# Patient Record
Sex: Female | Born: 1990 | Race: Black or African American | Hispanic: No | Marital: Single | Smoking: Current every day smoker
Health system: Southern US, Community
[De-identification: ages and names within clinical notes are randomized; demographics above are authoritative.]

---

## 2018-03-08 ENCOUNTER — Other Ambulatory Visit: Payer: Self-pay

## 2018-03-08 ENCOUNTER — Emergency Department (HOSPITAL_COMMUNITY)
Admission: EM | Admit: 2018-03-08 | Discharge: 2018-03-09 | Disposition: A | Discharge status: Home / Self Care | Payer: Self-pay | Attending: Emergency Medicine | Admitting: Emergency Medicine

## 2018-03-08 ENCOUNTER — Encounter (HOSPITAL_COMMUNITY): Payer: Self-pay

## 2018-03-08 DIAGNOSIS — R1084 Generalized abdominal pain: Secondary | ICD-10-CM | POA: Insufficient documentation

## 2018-03-08 DIAGNOSIS — F17290 Nicotine dependence, other tobacco product, uncomplicated: Secondary | ICD-10-CM | POA: Insufficient documentation

## 2018-03-08 DIAGNOSIS — R112 Nausea with vomiting, unspecified: Secondary | ICD-10-CM | POA: Insufficient documentation

## 2018-03-08 DIAGNOSIS — R197 Diarrhea, unspecified: Secondary | ICD-10-CM | POA: Insufficient documentation

## 2018-03-08 LAB — CBC
HCT: 45.4 % (ref 36.0–46.0)
Hemoglobin: 14.3 g/dL (ref 12.0–15.0)
MCH: 28.2 pg (ref 26.0–34.0)
MCHC: 31.5 g/dL (ref 30.0–36.0)
MCV: 89.5 fL (ref 78.0–100.0)
Platelets: 348 10*3/uL (ref 150–400)
RBC: 5.07 MIL/uL (ref 3.87–5.11)
RDW: 12.1 % (ref 11.5–15.5)
WBC: 8.7 10*3/uL (ref 4.0–10.5)

## 2018-03-08 LAB — URINALYSIS, ROUTINE W REFLEX MICROSCOPIC
Bilirubin Urine: NEGATIVE
Glucose, UA: NEGATIVE mg/dL
Hgb urine dipstick: NEGATIVE
Ketones, ur: NEGATIVE mg/dL
Leukocytes, UA: NEGATIVE
Nitrite: NEGATIVE
Protein, ur: NEGATIVE mg/dL
Specific Gravity, Urine: 1.017 (ref 1.005–1.030)
pH: 5 (ref 5.0–8.0)

## 2018-03-08 LAB — COMPREHENSIVE METABOLIC PANEL
ALT: 24 U/L (ref 14–54)
AST: 23 U/L (ref 15–41)
Albumin: 3.9 g/dL (ref 3.5–5.0)
Alkaline Phosphatase: 52 U/L (ref 38–126)
Anion gap: 8 (ref 5–15)
BUN: 7 mg/dL (ref 6–20)
CO2: 26 mmol/L (ref 22–32)
Calcium: 9 mg/dL (ref 8.9–10.3)
Chloride: 105 mmol/L (ref 101–111)
Creatinine, Ser: 0.89 mg/dL (ref 0.44–1.00)
GFR calc Af Amer: >60 mL/min (ref >60)
GFR calc non Af Amer: >60 mL/min (ref >60)
Glucose, Bld: 90 mg/dL (ref 65–99)
Potassium: 3.4 mmol/L — ABNORMAL LOW (ref 3.5–5.1)
Sodium: 139 mmol/L (ref 135–145)
Total Bilirubin: 0.3 mg/dL (ref 0.3–1.2)
Total Protein: 6.7 g/dL (ref 6.5–8.1)

## 2018-03-08 LAB — I-STAT BETA HCG BLOOD, ED (MC, WL, AP ONLY): I-stat hCG, quantitative: <5 m[IU]/mL (ref <5)

## 2018-03-08 LAB — LIPASE, BLOOD: Lipase: 39 U/L (ref 11–51)

## 2018-03-08 MED ORDER — SODIUM CHLORIDE 0.9 % IV BOLUS
1000.0000 mL | Freq: Once | INTRAVENOUS | Status: AC
  Administered 2018-03-09: 1000 mL via INTRAVENOUS

## 2018-03-08 NOTE — ED Provider Notes (Signed)
Patient placed in Quick Look pathway, seen and evaluated   Chief Complaint: abdominal pain, vomiting  HPI:   Patient presents for 4 day history of generalized abdominal pain, several episodes of NBNB emesis and diarrhea. She was drinking alcohol 3 nights ago which she believed worsened the symptoms. No sick contacts with similar symptoms. Denies fever, urinary symptoms.  ROS: vomiting  Physical Exam:   Gen: No distress  Neuro: Awake and Alert  Skin: Warm    Focused Exam: no abdominal TTP, NAD.   Initiation of care has begun. The patient has been counseled on the process, plan, and necessity for staying for the completion/evaluation, and the remainder of the medical screening examination    Dietrich PatesKhatri, Karey Stucki, PA-C 03/08/18 2055    Abelino DerrickMackuen, Courteney Lyn, MD 03/08/18 2352

## 2018-03-08 NOTE — ED Triage Notes (Signed)
Onset 4 days ago pt was drinking alcohol, started to feel nauseated, stopped drinking, woke up next day vomiting,last vomit yesterday.  C/o abd pain.

## 2018-03-09 MED ORDER — ONDANSETRON HCL 4 MG PO TABS: 4.0000 mg | ORAL_TABLET | Freq: Three times a day (TID) | ORAL | 0 refills | Status: AC | PRN

## 2018-03-09 NOTE — Discharge Instructions (Signed)
1. Medications: Take Zofran as needed for nausea.  Wait around 20 minutes before eating or drinking after taking this medication. °2. Treatment: rest, drink plenty of fluids, advance diet slowly.  Start with water and broth then advance to bland foods that will not upset your stomach such as crackers, mashed potatoes, and peanut butter. °3. Follow Up: Please followup with your primary doctor in 3 days for discussion of your diagnoses and further evaluation after today's visit; if you do not have a primary care doctor use the resource guide provided to find one; Please return to the ER for persistent vomiting, high fevers or worsening symptoms ° °

## 2018-03-09 NOTE — ED Provider Notes (Signed)
[facility] Provider Note   CSN: [id number] Arrival date & time: 03/08/18  2000     History   Chief Complaint Chief Complaint  Patient presents with  . Abdominal Pain  . Nausea    HPI Stephanie Barr is a 27 y.o. female with no significant past medical history presents today for evaluation of acute onset, progressively improving nausea, vomiting, and diarrhea for 4 days.  She states that on Friday she had 3 glasses of wine which is not uncharacteristic of her but she began to feel sick after the third glass.  She is unsure of what she had to eat that day but she states "I am sure it had a lot of hot sauce on it ".  She states she eats most meals with hot sauce.  Saturday, 3 days ago, she had multiple episodes of nonbloody nonbilious emesis and a few episodes of watery nonbloody diarrhea.  She last vomited yesterday.  Notes intermittent fleeting sharp generalized abdominal pains which will last for a few seconds and then resolved.  No aggravating or alleviating factors noted.  She denies fevers, chills, chest pain, shortness of breath, lightheadedness, constipation, melena, hematochezia, urinary symptoms, or vaginal itching, bleeding, or discharge.  She has not tried anything for her symptoms.  Endorses decreased oral intake but has been able to keep food down all day today.  The history is provided by the patient.    History reviewed. No pertinent past medical history.  There are no active problems to display for this patient.   History reviewed. No pertinent surgical history.   OB History   None      Home Medications    Prior to Admission medications   Medication Sig Start Date End Date Taking? Authorizing Provider  ondansetron (ZOFRAN) 4 MG tablet Take 1 tablet (4 mg total) by mouth every 8 (eight) hours as needed for nausea or vomiting. 03/09/18   Jeanie SewerFawze, Jamise Pentland A, PA-C    Family History History reviewed. No pertinent family  history.  Social History Social History   Tobacco Use  . Smoking status: Current Every Day Smoker    Types: Cigars  . Smokeless tobacco: Never Used  . Tobacco comment: less than 1 per day   Substance Use Topics  . Alcohol use: Yes  . Drug use: Never     Allergies   Patient has no known allergies.   Review of Systems Review of Systems  Constitutional: Negative for chills and fever.  Respiratory: Negative for shortness of breath.   Cardiovascular: Negative for chest pain.  Gastrointestinal: Positive for abdominal pain, diarrhea, nausea and vomiting. Negative for blood in stool and constipation.  Genitourinary: Negative for dysuria, hematuria, urgency, vaginal bleeding, vaginal discharge and vaginal pain.  All other systems reviewed and are negative.    Physical Exam Updated Vital Signs BP 120/77   Pulse (!) 58   Temp 98.8 F (37.1 C) (Oral)   Resp 12   Ht 5\' 6"  (1.676 m)   Wt 81.6 kg (180 lb)   LMP 02/24/2018   SpO2 99%   BMI 29.05 kg/m   Physical Exam  Constitutional: She appears well-developed and well-nourished. No distress.  Resting comfortably in bed, no apparent distress.  HENT:  Head: Normocephalic and atraumatic.  Eyes: Conjunctivae are normal. Right eye exhibits no discharge. Left eye exhibits no discharge.  Neck: No JVD present. No tracheal deviation present.  Cardiovascular: Normal rate, regular rhythm and normal heart sounds.  Pulmonary/Chest:  Effort normal and breath sounds normal.  Abdominal: Soft. Normal appearance and bowel sounds are normal. She exhibits no distension. There is no tenderness. There is no rigidity, no rebound, no guarding, no CVA tenderness, no tenderness at McBurney's point and negative Murphy's sign.  Musculoskeletal: She exhibits no edema.  No midline lumbar spine TTP, no paraspinal muscle tenderness, no deformity, crepitus, or step-off noted   Neurological: She is alert.  Skin: Skin is warm and dry. No erythema.   Psychiatric: She has a normal mood and affect. Her behavior is normal.  Nursing note and vitals reviewed.    ED Treatments / Results  Labs (all labs ordered are listed, but only abnormal results are displayed) Labs Reviewed  COMPREHENSIVE METABOLIC PANEL - Abnormal; Notable for the following components:      Result Value   Potassium 3.4 (*)    All other components within normal limits  LIPASE, BLOOD  CBC  URINALYSIS, ROUTINE W REFLEX MICROSCOPIC  I-STAT BETA HCG BLOOD, ED ([facility], [facility], [facility] ONLY)    EKG None  Radiology No results found.  Procedures Procedures (including critical care time)  Medications Ordered in ED Medications  sodium chloride 0.9 % bolus 1,000 mL (0 mLs Intravenous Stopped 03/09/18 0057)     Initial Impression / Assessment and Plan / ED Course  I have reviewed the triage vital signs and the nursing notes.  Pertinent labs & imaging results that were available during my care of the patient were reviewed by me and considered in my medical decision making (see chart for details).     Patient presents with 4-day history of progressively improving nausea, vomiting, diarrhea for 4 days.  She is afebrile, vital signs are stable. Patient is nontoxic, nonseptic appearing, in no apparent distress.  Patient's pain and other symptoms adequately managed in emergency department.  Fluid bolus given.  Labs, imaging and vitals reviewed.  Lab work shows no leukocytosis, no anemia.  Very mild hypokalemia with potassium of 3.4.  Otherwise creatinine, lipase, and LFTs are within normal limits.  UA is not concerning for UTI or nephrolithiasis.  Patient does not meet the SIRS or Sepsis criteria. No indication of appendicitis, bowel obstruction, bowel perforation, cholecystitis, diverticulitis, TOA, ovarian torsion, PID or ectopic pregnancy.  Suspect possible gastroenteritis.  On reevaluation, patient is resting comfortably no apparent distress, tolerating p.o. food and fluids without  difficulty.  Serial abdominal examinations remain benign with no peritoneal signs.  Patient discharged home with symptomatic treatment and given strict instructions for follow-up with their primary care physician.  Discussed strict ED return precautions.  Patient and patient's significant other verbalized understanding of and agreement with plan and patient is stable for discharge home at this time.     Final Clinical Impressions(s) / ED Diagnoses   Final diagnoses:  Nausea vomiting and diarrhea  Generalized abdominal pain    ED Discharge Orders        Ordered    ondansetron (ZOFRAN) 4 MG tablet  Every 8 hours PRN     03/09/18 0048       Jeanie Sewer, PA-C 03/09/18 0113    Shaune Pollack, MD 03/09/18 1029

## 2018-03-09 NOTE — ED Notes (Signed)
Pt verbalizes understanding of d/c instructions. Pt received prescriptions. Pt ambulatory at d/c with all belongings and with family.   

## 2018-03-09 NOTE — ED Notes (Signed)
ED Provider at bedside. 

## 2018-07-11 ENCOUNTER — Ambulatory Visit (HOSPITAL_COMMUNITY)
Admission: EM | Admit: 2018-07-11 | Discharge: 2018-07-11 | Discharge status: Absent without leave | Payer: PRIVATE HEALTH INSURANCE

## 2018-12-05 ENCOUNTER — Ambulatory Visit: Payer: PRIVATE HEALTH INSURANCE

## 2019-03-28 ENCOUNTER — Other Ambulatory Visit: Payer: Self-pay

## 2019-03-28 ENCOUNTER — Emergency Department (HOSPITAL_COMMUNITY)
Admission: EM | Admit: 2019-03-28 | Discharge: 2019-03-28 | Disposition: A | Discharge status: Home / Self Care | Payer: PRIVATE HEALTH INSURANCE | Attending: Emergency Medicine | Admitting: Emergency Medicine

## 2019-03-28 DIAGNOSIS — U071 COVID-19: Secondary | ICD-10-CM | POA: Diagnosis not present

## 2019-03-28 DIAGNOSIS — R05 Cough: Secondary | ICD-10-CM | POA: Diagnosis not present

## 2019-03-28 DIAGNOSIS — Z720 Tobacco use: Secondary | ICD-10-CM | POA: Insufficient documentation

## 2019-03-28 DIAGNOSIS — B9789 Other viral agents as the cause of diseases classified elsewhere: Secondary | ICD-10-CM

## 2019-03-28 DIAGNOSIS — R0602 Shortness of breath: Secondary | ICD-10-CM | POA: Diagnosis present

## 2019-03-28 DIAGNOSIS — J988 Other specified respiratory disorders: Secondary | ICD-10-CM

## 2019-03-28 NOTE — Discharge Instructions (Addendum)
Please read attached information. If you experience any new or worsening signs or symptoms please return to the emergency room for evaluation. Please follow-up with your primary care provider or specialist as discussed.  °

## 2019-03-28 NOTE — ED Provider Notes (Signed)
[facility] Provider Note   CSN: [id number] Arrival date & time: 03/28/19  1416    History   Chief Complaint Chief Complaint  Patient presents with  . Cough  . Shortness of Breath    HPI Stephanie Barr is a 28 y.o. female.     HPI   28 year old female presents today with complaints of cough.  She notes a one-week history of cough and shortness of breath.  She notes shortness of breath is when she is talking, not with ambulation.  She denies any productive cough.  She denies any fever, she reports loss of taste and smell.  She denies any close sick contacts.  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  She notes she smokes Black and milds, denies any other chronic health conditions.  No past medical history on file.  There are no active problems to display for this patient.   No past surgical history on file.   OB History   No obstetric history on file.      Home Medications    Prior to Admission medications   Medication Sig Start Date End Date Taking? Authorizing Provider  ondansetron (ZOFRAN) 4 MG tablet Take 1 tablet (4 mg total) by mouth every 8 (eight) hours as needed for nausea or vomiting. 03/09/18   Jeanie SewerFawze, Mina A, PA-C    Family History No family history on file.  Social History Social History   Tobacco Use  . Smoking status: Current Every Day Smoker    Types: Cigars  . Smokeless tobacco: Never Used  . Tobacco comment: less than 1 per day   Substance Use Topics  . Alcohol use: Yes  . Drug use: Never     Allergies   Patient has no known allergies.   Review of Systems Review of Systems  All other systems reviewed and are negative.    Physical Exam Updated Vital Signs BP 133/85   Pulse 98   Temp 99 F (37.2 C) (Oral)   Resp 16   LMP 03/01/2019   SpO2 100%   Physical Exam Vitals signs and nursing note reviewed.  Constitutional:      Appearance: She is well-developed.  HENT:     Head:  Normocephalic and atraumatic.  Eyes:     General: No scleral icterus.       Right eye: No discharge.        Left eye: No discharge.     Conjunctiva/sclera: Conjunctivae normal.     Pupils: Pupils are equal, round, and reactive to light.  Neck:     Musculoskeletal: Normal range of motion.     Vascular: No JVD.     Trachea: No tracheal deviation.  Pulmonary:     Effort: Pulmonary effort is normal. No respiratory distress.     Breath sounds: Normal breath sounds. No stridor. No wheezing, rhonchi or rales.  Neurological:     Mental Status: She is alert and oriented to person, place, and time.     Coordination: Coordination normal.  Psychiatric:        Behavior: Behavior normal.        Thought Content: Thought content normal.        Judgment: Judgment normal.     ED Treatments / Results  Labs (all labs ordered are listed, but only abnormal results are displayed) Labs Reviewed  NOVEL CORONAVIRUS, NAA (HOSPITAL ORDER, SEND-OUT TO REF LAB)    EKG None  Radiology No results found.  Procedures Procedures (  including critical care time)  Medications Ordered in ED Medications - No data to display   Initial Impression / Assessment and Plan / ED Course  I have reviewed the triage vital signs and the nursing notes.  Pertinent labs & imaging results that were available during my care of the patient were reviewed by me and considered in my medical decision making (see chart for details).        Assessment/Plan: 28 year old female presents today with likely viral upper respiratory infection.  Question COVID.  She will be tested here, strict return precautions given.  She verbalized understanding and agreement to today's plan had no further questions or concerns at the time of discharge.   Final Clinical Impressions(s) / ED Diagnoses   Final diagnoses:  Viral respiratory infection    ED Discharge Orders    None       Francee Gentile 03/28/19 1741    Valarie Merino, MD 04/02/19 1102

## 2019-03-28 NOTE — ED Triage Notes (Signed)
Onset 1 week nonproductive cough.  Onset 2 days loss of taste/smell, chest pain, and shortness of breath. Talking in complete sentences.

## 2019-03-30 LAB — NOVEL CORONAVIRUS, NAA (HOSP ORDER, SEND-OUT TO REF LAB; TAT 18-24 HRS): SARS-CoV-2, NAA: DETECTED — AB

## 2019-06-20 ENCOUNTER — Telehealth: Payer: PRIVATE HEALTH INSURANCE | Admitting: Nurse Practitioner

## 2019-06-20 DIAGNOSIS — R059 Cough, unspecified: Secondary | ICD-10-CM

## 2019-06-20 DIAGNOSIS — R509 Fever, unspecified: Secondary | ICD-10-CM

## 2019-06-20 DIAGNOSIS — Z20822 Contact with and (suspected) exposure to covid-19: Secondary | ICD-10-CM

## 2019-06-20 DIAGNOSIS — R519 Headache, unspecified: Secondary | ICD-10-CM

## 2019-06-20 DIAGNOSIS — R05 Cough: Secondary | ICD-10-CM

## 2019-06-20 MED ORDER — BENZONATATE 100 MG PO CAPS: 100.0000 mg | ORAL_CAPSULE | Freq: Three times a day (TID) | ORAL | 0 refills | Status: DC | PRN

## 2019-06-20 NOTE — Progress Notes (Signed)
E-Visit for Corona Virus Screening   Your current symptoms could be consistent with the coronavirus.  Many health care providers can now test patients at their office but not all are.  [facility] has multiple testing sites. For information on our COVID testing locations and hours go to [email or web address]  Please quarantine yourself while awaiting your test results.  We are enrolling you in our MyChart Home Montioring for COVID19 . Daily you will receive a questionnaire within the MyChart website. Our COVID 19 response team willl be monitoriing your responses daily.  You can go to one of the  testing sites listed below, while they are opened (see hours). You do not need a doctors order to be tested for covid.You do need to self-isolate until your results return and if positive 14 days from when your symptoms started and until you are 3 days symptom free.   Testing Locations (Monday - Friday, 8 a.m. - 3:30 p.m.) . [place]: [facility], [address], [place], [place]  . [place]: [facility], [address], [place], [place] (entrance off [address])  . [place]: [address], [place],  (across from [facility])    COVID-19 is a respiratory illness with symptoms that are similar to the flu. Symptoms are typically mild to moderate, but there have been cases of severe illness and death due to the virus. The following symptoms may appear 2-14 days after exposure: . Fever . Cough . Shortness of breath or difficulty breathing . Chills . Repeated shaking with chills . Muscle pain . Headache . Sore throat . New loss of taste or smell . Fatigue . Congestion or runny nose . Nausea or vomiting . Diarrhea  It is vitally important that if you feel that you have an infection such as this virus or any other virus that you stay home and away from places where you may  spread it to others.  You should self-quarantine for 14 days if you have symptoms that could potentially be coronavirus or have been in close contact a with a person diagnosed with COVID-19 within the last 2 weeks. You should avoid contact with people age 65 and older.   You should wear a mask or cloth face covering over your nose and mouth if you must be around other people or animals, including pets (even at home). Try to stay at least 6 feet away from other people. This will protect the people around you.  You can use medication such as A prescription cough medication called Tessalon Perles 100 mg. You may take 1-2 capsules every 8 hours as needed for cough  You may also take acetaminophen (Tylenol) as needed for fever.   Reduce your risk of any infection by using the same precautions used for avoiding the common cold or flu:  . Wash your hands often with soap and warm water for at least 20 seconds.  If soap and water are not readily available, use an alcohol-based hand sanitizer with at least 60% alcohol.  . If coughing or sneezing, cover your mouth and nose by coughing or sneezing into the elbow areas of your shirt or coat, into a tissue or into your sleeve (not your hands). . Avoid shaking hands with others and consider head nods or verbal greetings only. . Avoid touching your eyes, nose, or mouth with unwashed hands.  . Avoid close contact with people who are sick. . Avoid places or   events with large numbers of people in one location, like concerts or sporting events. . Carefully consider travel plans you have or are making. . If you are planning any travel outside or inside the [place], visit the CDC's Travelers' Health webpage for the latest health notices. . If you have some symptoms but not all symptoms, continue to monitor at home and seek medical attention if your symptoms worsen. . If you are having a medical emergency, call 911.  HOME CARE . Only take medications as instructed by your  medical team. . Drink plenty of fluids and get plenty of rest. . A steam or ultrasonic humidifier can help if you have congestion.   GET HELP RIGHT AWAY IF YOU HAVE EMERGENCY WARNING SIGNS** FOR COVID-19. If you or someone is showing any of these signs seek emergency medical care immediately. Call 911 or proceed to your closest emergency facility if: . You develop worsening high fever. . Trouble breathing . Bluish lips or face . Persistent pain or pressure in the chest . New confusion . Inability to wake or stay awake . You cough up blood. . Your symptoms become more severe  **This list is not all possible symptoms. Contact your medical provider for any symptoms that are sever or concerning to you.   MAKE SURE YOU   Understand these instructions.  Will watch your condition.  Will get help right away if you are not doing well or get worse.  Your e-visit answers were reviewed by a board certified advanced clinical practitioner to complete your personal care plan.  Depending on the condition, your plan could have included both over the counter or prescription medications.  If there is a problem please reply once you have received a response from your provider.  Your safety is important to us.  If you have drug allergies check your prescription carefully.    You can use MyChart to ask questions about today's visit, request a non-urgent call back, or ask for a work or school excuse for 24 hours related to this e-Visit. If it has been greater than 24 hours you will need to follow up with your provider, or enter a new e-Visit to address those concerns. You will get an e-mail in the next two days asking about your experience.  I hope that your e-visit has been valuable and will speed your recovery. Thank you for using e-visits.   5-10 minutes spent reviewing and documenting in chart.  

## 2019-07-17 ENCOUNTER — Other Ambulatory Visit: Payer: Self-pay | Admitting: *Deleted

## 2019-07-17 ENCOUNTER — Other Ambulatory Visit: Payer: Self-pay

## 2019-07-17 DIAGNOSIS — Z124 Encounter for screening for malignant neoplasm of cervix: Secondary | ICD-10-CM

## 2019-07-17 NOTE — Progress Notes (Signed)
Patient: Stephanie Barr           Date of Birth: December 02, 1990           MRN: [id number] Visit Date: 07/17/2019 PCP: Patient, No Pcp Per  Temp: 99.0 Oral  Cervical Cancer Screening Do you smoke?: Yes Have you ever had or been told you have an allergy to latex products?: No Marital status: Single Date of last pap smear: 2-5 yrs ago Date of last menstrual period: 06/24/19 Number of pregnancies: 1 Number of births: 0 Have you ever had any of the following? Hysterectomy: No Tubal ligation (tubes tied): No Abnormal bleeding: No Abnormal pap smear: Yes Venereal warts: No A sex partner with venereal warts: No A high risk* sex partner: No  Cervical Exam Normal Exam. Patient has a history of an abnormal Pap smear in 2016 that a colposcopy was recommended per patient. Patient stated she did not have any follow-up completed. If today's Pap smear is normal next Pap smear will be due in one year.  Patient's History There are no active problems to display for this patient.  No past medical history on file.  No family history on file.  Social History   Occupational History  . Not on file  Tobacco Use  . Smoking status: Current Every Day Smoker    Types: Cigars  . Smokeless tobacco: Never Used  . Tobacco comment: less than 1 per day   Substance and Sexual Activity  . Alcohol use: Yes  . Drug use: Never  . Sexual activity: Not on file

## 2019-07-19 LAB — CYTOLOGY - PAP: Diagnosis: NEGATIVE

## 2019-07-24 ENCOUNTER — Telehealth (HOSPITAL_COMMUNITY): Payer: Self-pay | Admitting: *Deleted

## 2019-07-24 ENCOUNTER — Other Ambulatory Visit: Payer: Self-pay | Admitting: Obstetrics and Gynecology

## 2019-07-24 MED ORDER — METRONIDAZOLE 500 MG PO TABS: 500.0000 mg | ORAL_TABLET | Freq: Two times a day (BID) | ORAL | 0 refills | Status: DC

## 2019-07-24 NOTE — Telephone Encounter (Signed)
Patient returned my phone call. Explained to patient that her Pap smear was normal and it did show BV. Explained to patient BV and that a prescription for Flagyl an antibiotic was sent to her pharmacy. Patient advised to avoid alcohol while taking antibiotic. Verified patients pharmacy. Explained to patient that due her history of an abnormal Pap smear that her next Pap smear will be due in one year. Patient verbalized understanding.

## 2019-07-24 NOTE — Telephone Encounter (Signed)
Attempted to call patient to discuss Pap smear results. No one answered the phone. Left voicemail for patient to call me back. 

## 2019-11-02 ENCOUNTER — Emergency Department (HOSPITAL_COMMUNITY): Payer: Self-pay

## 2019-11-02 ENCOUNTER — Other Ambulatory Visit: Payer: Self-pay

## 2019-11-02 ENCOUNTER — Encounter (HOSPITAL_COMMUNITY): Payer: Self-pay | Admitting: *Deleted

## 2019-11-02 ENCOUNTER — Emergency Department (HOSPITAL_COMMUNITY)
Admission: EM | Admit: 2019-11-02 | Discharge: 2019-11-02 | Disposition: A | Discharge status: Home / Self Care | Payer: Self-pay | Attending: Emergency Medicine | Admitting: Emergency Medicine

## 2019-11-02 DIAGNOSIS — R300 Dysuria: Secondary | ICD-10-CM | POA: Insufficient documentation

## 2019-11-02 DIAGNOSIS — N898 Other specified noninflammatory disorders of vagina: Secondary | ICD-10-CM | POA: Insufficient documentation

## 2019-11-02 DIAGNOSIS — F17290 Nicotine dependence, other tobacco product, uncomplicated: Secondary | ICD-10-CM | POA: Insufficient documentation

## 2019-11-02 DIAGNOSIS — N83519 Torsion of ovary and ovarian pedicle, unspecified side: Secondary | ICD-10-CM

## 2019-11-02 DIAGNOSIS — R102 Pelvic and perineal pain: Secondary | ICD-10-CM | POA: Insufficient documentation

## 2019-11-02 DIAGNOSIS — N83209 Unspecified ovarian cyst, unspecified side: Secondary | ICD-10-CM

## 2019-11-02 LAB — CBC WITH DIFFERENTIAL/PLATELET
Abs Immature Granulocytes: 0.04 10*3/uL (ref 0.00–0.07)
Basophils Absolute: 0 10*3/uL (ref 0.0–0.1)
Basophils Relative: 0 %
Eosinophils Absolute: 0.1 10*3/uL (ref 0.0–0.5)
Eosinophils Relative: 1 %
HCT: 41.5 % (ref 36.0–46.0)
Hemoglobin: 13.7 g/dL (ref 12.0–15.0)
Immature Granulocytes: 0 %
Lymphocytes Relative: 23 %
Lymphs Abs: 2.3 10*3/uL (ref 0.7–4.0)
MCH: 29.8 pg (ref 26.0–34.0)
MCHC: 33 g/dL (ref 30.0–36.0)
MCV: 90.2 fL (ref 80.0–100.0)
Monocytes Absolute: 0.8 10*3/uL (ref 0.1–1.0)
Monocytes Relative: 8 %
Neutro Abs: 6.7 10*3/uL (ref 1.7–7.7)
Neutrophils Relative %: 68 %
Platelets: 297 10*3/uL (ref 150–400)
RBC: 4.6 MIL/uL (ref 3.87–5.11)
RDW: 11.8 % (ref 11.5–15.5)
WBC: 9.9 10*3/uL (ref 4.0–10.5)
nRBC: 0 % (ref 0.0–0.2)

## 2019-11-02 LAB — BASIC METABOLIC PANEL
Anion gap: 14 (ref 5–15)
BUN: 12 mg/dL (ref 6–20)
CO2: 23 mmol/L (ref 22–32)
Calcium: 8.9 mg/dL (ref 8.9–10.3)
Chloride: 99 mmol/L (ref 98–111)
Creatinine, Ser: 0.81 mg/dL (ref 0.44–1.00)
GFR calc Af Amer: >60 mL/min (ref >60)
GFR calc non Af Amer: >60 mL/min (ref >60)
Glucose, Bld: 93 mg/dL (ref 70–99)
Potassium: 3.9 mmol/L (ref 3.5–5.1)
Sodium: 136 mmol/L (ref 135–145)

## 2019-11-02 LAB — URINALYSIS, ROUTINE W REFLEX MICROSCOPIC
Bilirubin Urine: NEGATIVE
Glucose, UA: NEGATIVE mg/dL
Hgb urine dipstick: NEGATIVE
Ketones, ur: NEGATIVE mg/dL
Leukocytes,Ua: NEGATIVE
Nitrite: NEGATIVE
Protein, ur: NEGATIVE mg/dL
Specific Gravity, Urine: 1.019 (ref 1.005–1.030)
pH: 5 (ref 5.0–8.0)

## 2019-11-02 LAB — WET PREP, GENITAL
Sperm: NONE SEEN
Trich, Wet Prep: NONE SEEN
Yeast Wet Prep HPF POC: NONE SEEN

## 2019-11-02 LAB — HIV ANTIBODY (ROUTINE TESTING W REFLEX): HIV Screen 4th Generation wRfx: NONREACTIVE

## 2019-11-02 LAB — I-STAT BETA HCG BLOOD, ED (MC, WL, AP ONLY): I-stat hCG, quantitative: <5 m[IU]/mL (ref <5)

## 2019-11-02 LAB — RPR: RPR Ser Ql: NONREACTIVE

## 2019-11-02 MED ORDER — NAPROXEN 500 MG PO TABS: 500.0000 mg | ORAL_TABLET | Freq: Two times a day (BID) | ORAL | 0 refills | Status: AC | PRN

## 2019-11-02 NOTE — ED Triage Notes (Signed)
Pt c/o intermittent lower abd pain since 11pm. Denies vaginal bleeding/ discharge. Last BM yesterday and was normal.

## 2019-11-02 NOTE — Discharge Instructions (Signed)
I have placed for you an ambulatory referral to OB/GYN.  The office of Dr. Leroy Libman should be contacting you shortly to schedule an appointment.  If you do not hear from them, please call the number provided to schedule your appointment for further evaluation and ongoing management.  Please return to the ED or seek immediate medical attention should he develop any fevers or chills, uncontrolled nausea or vomiting, worsening abdominal/pelvic pain, or any other new or worsening symptoms.

## 2019-11-02 NOTE — ED Notes (Signed)
Patient transported to Ultrasound 

## 2019-11-02 NOTE — ED Provider Notes (Signed)
[facility] EMERGENCY DEPARTMENT Provider Note   CSN: [id number] Arrival date & time: 11/02/19  1443     History Chief Complaint  Patient presents with  . Abdominal Pain    Stephanie Barr is a 29 y.o. female with no relevant PMH presents to the ED with a 1 day history of suprapubic abdominal pain.  Patient reports around 11 PM last night she developed acute onset 9 out of 10 nonradiating suprapubic discomfort.  She is also endorsing dysuria, increased frequency/urgency, and mild vaginal discharge.  She reports that her last menses was around 10/11/2019.  She denies any fevers or chills, chest pain or difficulty breathing, cough, recent illness, nausea or vomiting, change in bowel habits, or diminished appetite.  She is sexually active with one partner and they do not use protection.  She has been treated for acute vaginosis and yeast infection with in the past 2 months. Remote history over 5 years ago of chlamydia and 1 chemical abortion.  HPI     History reviewed. No pertinent past medical history.  There are no problems to display for this patient.   History reviewed. No pertinent surgical history.   OB History   No obstetric history on file.     No family history on file.  Social History   Tobacco Use  . Smoking status: Current Every Day Smoker    Types: Cigars  . Smokeless tobacco: Never Used  . Tobacco comment: less than 1 per day   Substance Use Topics  . Alcohol use: Yes  . Drug use: Never    Home Medications Prior to Admission medications   Medication Sig Start Date End Date Taking? Authorizing Provider  benzonatate (TESSALON PERLES) 100 MG capsule Take 1 capsule (100 mg total) by mouth 3 (three) times daily as needed. 06/20/19   Daphine Deutscher, Mary-Margaret, FNP  metroNIDAZOLE (FLAGYL) 500 MG tablet Take 1 tablet (500 mg total) by mouth 2 (two) times daily. 07/24/19   Constant, Peggy, MD  naproxen (NAPROSYN) 500 MG tablet Take 1 tablet (500 mg  total) by mouth 2 (two) times daily between meals as needed for moderate pain. 11/02/19   Lorelee New, PA-C  ondansetron (ZOFRAN) 4 MG tablet Take 1 tablet (4 mg total) by mouth every 8 (eight) hours as needed for nausea or vomiting. 03/09/18   Michela Pitcher A, PA-C    Allergies    Patient has no known allergies.  Review of Systems   Review of Systems  Constitutional: Negative for appetite change and fever.  Gastrointestinal: Positive for abdominal pain. Negative for nausea and vomiting.  Genitourinary: Positive for dysuria, urgency and vaginal discharge. Negative for flank pain and vaginal bleeding.    Physical Exam Updated Vital Signs BP 120/79   Pulse 65   Temp 98.4 F (36.9 C)   Resp 18   Ht 5\' 6"  (1.676 m)   Wt 72.6 kg   LMP 10/09/2019   SpO2 97%   BMI 25.82 kg/m   Physical Exam Vitals and nursing note reviewed. Exam conducted with a chaperone present.  Constitutional:      Appearance: Normal appearance.  HENT:     Head: Normocephalic and atraumatic.  Eyes:     General: No scleral icterus.    Conjunctiva/sclera: Conjunctivae normal.  Cardiovascular:     Rate and Rhythm: Normal rate and regular rhythm.     Pulses: Normal pulses.     Heart sounds: Normal heart sounds.  Pulmonary:     Effort:  Pulmonary effort is normal. No respiratory distress.     Breath sounds: Normal breath sounds.  Abdominal:     Comments: Soft, nondistended.  TTP over suprapubic region.  No TTP elsewhere.   Negative McBurney's point tenderness.  Negative Rovsing sign.  No guarding.  No overlying skin changes.  No masses appreciated.  Normoactive bowel sounds.  Genitourinary:    Comments: Speculum exam: Thin, white discharge.  No significant cervical erythema. Bimanual exam: No CMT.  No adnexal TTP.  No masses appreciated.  Normal bimanual exam. Skin:    General: Skin is dry.     Capillary Refill: Capillary refill takes less than 2 seconds.  Neurological:     Mental Status: She is alert and  oriented to person, place, and time.     GCS: GCS eye subscore is 4. GCS verbal subscore is 5. GCS motor subscore is 6.  Psychiatric:        Mood and Affect: Mood normal.        Behavior: Behavior normal.        Thought Content: Thought content normal.     ED Results / Procedures / Treatments   Labs (all labs ordered are listed, but only abnormal results are displayed) Labs Reviewed  WET PREP, GENITAL - Abnormal; Notable for the following components:      Result Value   Clue Cells Wet Prep HPF POC PRESENT (*)    WBC, Wet Prep HPF POC MANY (*)    All other components within normal limits  CBC WITH DIFFERENTIAL/PLATELET  BASIC METABOLIC PANEL  URINALYSIS, ROUTINE W REFLEX MICROSCOPIC  HIV ANTIBODY (ROUTINE TESTING W REFLEX)  RPR  I-STAT BETA HCG BLOOD, ED ([facility], [facility], [facility] ONLY)  GC/CHLAMYDIA PROBE AMP ([place]) NOT AT [facility]  WET PREP  (BD AFFIRM) ([place])    EKG None  Radiology US PELVIC COMPLETE W TRANSVAGINAL AND TORSION R/O  Result Date: 11/02/2019 CLINICAL DATA:  Pelvic pain EXAM: TRANSABDOMINAL AND TRANSVAGINAL ULTRASOUND OF PELVIS DOPPLER ULTRASOUND OF OVARIES TECHNIQUE: Study was performed transabdominally to optimize pelvic field of view evaluation and transvaginally to optimize internal visceral architecture evaluation. Color and duplex Doppler ultrasound was utilized to evaluate blood flow to the ovaries. COMPARISON:  None. FINDINGS: Uterus Measurements: 8.3 x 3.4 x 5.8 cm = volume: 84.5 mL. No fibroids or other mass visualized. Endometrium Thickness: 8 mm.  No focal abnormality visualized. Right ovary Measurements: 3.3 x 2.0 x 2.3 cm = volume: 8.0 mL. Normal appearance/no adnexal mass. Left ovary Measurements: 3 7.0 x 4.5 x 7.2 cm = volume: 119.1 mL. There is a complex multiseptated cystic mass arising from the left ovary which contains debris. This complex mass measures 5.8 x 5.7 x 4.4 cm. Pulsed Doppler evaluation of both ovaries demonstrates normal low-resistance  arterial and venous waveforms. Other findings: There is a small amount of free pelvic fluid. IMPRESSION: 1. There is a complex multi septated cystic left adnexal mass which contains debris. Differential considerations for this mass include hemorrhagic cyst, endometrioma, tubo-ovarian abscess, or atypical appearing ovarian neoplasm. Gynecologic assessment advised. 2. There is low resistance Doppler flow in each ovary. No overt ovarian torsion is seen currently. A left adnexal region mass of the size noted on the left currently does place patient at increased risk for intermittent ovarian torsion. 3. Small amount of fluid in the cul-de-sac region may be indicative of recent ovarian cyst leakage or rupture. 4.  Uterus and endometrium appear unremarkable. Electronically Signed   By: Bretta Bang  III M.D.   On: 11/02/2019 10:07    Procedures Procedures (including critical care time)  Medications Ordered in ED Medications - No data to display  ED Course  I have reviewed the triage vital signs and the nursing notes.  Pertinent labs & imaging results that were available during my care of the patient were reviewed by me and considered in my medical decision making (see chart for details).  Clinical Course as of Nov 01 1125  Thu Nov 02, 2019  1050 Spoke with Dr. Vivien Rota OB/GYN who believes that her ultrasound findings are possibly incidental and more consistent with complex cyst and lower suspicion for tubo-ovarian abscess.  She is recommending outpatient follow-up.  We will place an ambulatory referral.   [GG]    Clinical Course User Index [GG] Corena Herter, PA-C   MDM Rules/Calculators/A&P                      Physical exam demonstrated suprapubic TTP, however her pelvic exam was relatively benign aside from thin white discharge in vaginal vault.  No CMT, cervicitis, or adnexal TTP.  Patient's lab work was reassuring and her vital signs have been within normal limits.  Pelvic US  obtained demonstrated 5 x 6 cm complex mass on left adnexa with possible concern for tubo-ovarian abscess.  However, no definitive evidence of torsion.  Consulted with Dr. Vivien Rota OB/GYN who has lower suspicion for TOA at this time given lack of fevers/chills, no leukocytosis, pain reassessed at 2 out of 10, no cervical motion tenderness and overall benign pelvic exam, no nausea or vomiting, and sudden onset symptoms.  Suspects that perhaps her complex cyst was irritated.  Will place ambulatory referral to follow-up with Dr. Rosana Hoes outpatient.    Strict return precautions discussed with patient.  All of the evaluation and work-up results were discussed with the patient and any family at bedside. They were provided opportunity to ask any additional questions and have none at this time. They have expressed understanding of verbal discharge instructions as well as return precautions and are agreeable to the plan.    Final Clinical Impression(s) / ED Diagnoses Final diagnoses:  Pelvic pain    Rx / DC Orders ED Discharge Orders         Ordered    Ambulatory referral to Obstetrics / Gynecology     11/02/19 1050    naproxen (NAPROSYN) 500 MG tablet  2 times daily between meals PRN     11/02/19 1124           Reita Chard 11/02/19 1127    Quintella Reichert, MD 11/04/19 1027

## 2019-11-02 NOTE — ED Notes (Signed)
Pt returns from US.

## 2019-11-02 NOTE — ED Notes (Signed)
Pt to US.

## 2019-11-02 NOTE — ED Triage Notes (Signed)
Pt in with low abdominal pain since last night. Reports urine frequency, hesitancy and burning

## 2019-11-03 ENCOUNTER — Telehealth: Payer: Self-pay | Admitting: Medical

## 2019-11-03 DIAGNOSIS — A749 Chlamydial infection, unspecified: Secondary | ICD-10-CM

## 2019-11-03 LAB — GC/CHLAMYDIA PROBE AMP (~~LOC~~) NOT AT ARMC
Chlamydia: POSITIVE — AB
Neisseria Gonorrhea: NEGATIVE

## 2019-11-03 MED ORDER — AZITHROMYCIN 250 MG PO TABS: 1000.0000 mg | ORAL_TABLET | Freq: Once | ORAL | 0 refills | Status: AC

## 2019-11-03 NOTE — Telephone Encounter (Addendum)
Boneta Lucks tested positive for  Chlamydia. Patient was called by RN and allergies and pharmacy confirmed. Rx sent to pharmacy of choice.   Kathlene Cote 11/03/2019 12:17 PM      ----- Message from Kathe Becton, RN sent at 11/03/2019 12:06 PM EST ----- This patient tested positive for single:   Chlamydia   She has "NKDA",I have informed the patient of her results and confirmed her pharmacy is correct in her chart. Please send Rx.   Thank you,   Kathe Becton, RN   Results faxed to [facility] Department.

## 2019-12-04 ENCOUNTER — Ambulatory Visit (INDEPENDENT_AMBULATORY_CARE_PROVIDER_SITE_OTHER): Payer: PRIVATE HEALTH INSURANCE | Admitting: Obstetrics and Gynecology

## 2019-12-04 ENCOUNTER — Other Ambulatory Visit: Payer: Self-pay

## 2019-12-04 ENCOUNTER — Encounter: Payer: Self-pay | Admitting: Obstetrics and Gynecology

## 2019-12-04 DIAGNOSIS — N898 Other specified noninflammatory disorders of vagina: Secondary | ICD-10-CM

## 2019-12-04 DIAGNOSIS — B9689 Other specified bacterial agents as the cause of diseases classified elsewhere: Secondary | ICD-10-CM

## 2019-12-04 DIAGNOSIS — N760 Acute vaginitis: Secondary | ICD-10-CM

## 2019-12-04 DIAGNOSIS — N83202 Unspecified ovarian cyst, left side: Secondary | ICD-10-CM

## 2019-12-04 DIAGNOSIS — Z113 Encounter for screening for infections with a predominantly sexual mode of transmission: Secondary | ICD-10-CM

## 2019-12-04 DIAGNOSIS — A749 Chlamydial infection, unspecified: Secondary | ICD-10-CM

## 2019-12-04 DIAGNOSIS — N83209 Unspecified ovarian cyst, unspecified side: Secondary | ICD-10-CM | POA: Insufficient documentation

## 2019-12-04 NOTE — Progress Notes (Signed)
Ms Caba presents for ER follow up from 11/02/19 for chlamydia and complex left ovarian cyst. She reports having completed medication but still some vaginal discharge Denies any pain today. No bowel or bladder dysfunction. Cycles regular and monthly last 3 - 5 days, heavy with cramps and clots. Cycle in Feb was only 1 day however. Sexual active without problems or contraception Last pap 10/20, normal  PE AF VSS Lungs clear Heart RRR Abd soft + BS  A/P Left ovarian cyst        Chlamydia  TOC today. F/U GYN U/S in 2 week. F/U per test and U/S results.

## 2019-12-04 NOTE — Patient Instructions (Signed)
Ovarian Cyst An ovarian cyst is a fluid-filled sac on an ovary. The ovaries are organs that make eggs in women. Most ovarian cysts go away on their own and are not cancerous (are benign). Some cysts need treatment. Follow these instructions at home:  Take over-the-counter and prescription medicines only as told by your doctor.  Do not drive or use heavy machinery while taking prescription pain medicine.  Get pelvic exams and Pap tests as often as told by your doctor.  Return to your normal activities as told by your doctor. Ask your doctor what activities are safe for you.  Do not use any products that contain nicotine or tobacco, such as cigarettes and e-cigarettes. If you need help quitting, ask your doctor.  Keep all follow-up visits as told by your doctor. This is important. Contact a doctor if:  Your periods are: ? Late. ? Irregular. ? Painful.   Your periods stop.  You have pelvic pain that does not go away.  You have pressure on your bladder.  You have trouble making your bladder empty when you pee (urinate).  You have pain during sex.  You have any of the following in your belly (abdomen): ? A feeling of fullness. ? Pressure. ? Discomfort. ? Pain that does not go away. ? Swelling.  You feel sick most of the time.  You have trouble pooping (have constipation).  You are not as hungry as usual (you lose your appetite).  You get very bad acne.  You start to have more hair on your body and face.  You are gaining weight or losing weight without changing your exercise and eating habits.  You think you may be pregnant. Get help right away if:  You have belly pain that is very bad or gets worse.  You cannot eat or drink without throwing up (vomiting).  You suddenly get a fever.  Your period is a lot heavier than usual. This information is not intended to replace advice given to you by your health care provider. Make sure you discuss any questions you have  with your health care provider. Document Revised: 08/20/2017 Document Reviewed: 02/09/2016 Elsevier Patient Education  2020 [employer]  

## 2019-12-05 LAB — CERVICOVAGINAL ANCILLARY ONLY
Bacterial Vaginitis (gardnerella): POSITIVE — AB
Candida Glabrata: NEGATIVE
Candida Vaginitis: NEGATIVE
Chlamydia: NEGATIVE
Comment: NEGATIVE
Comment: NEGATIVE
Comment: NEGATIVE
Comment: NEGATIVE
Comment: NEGATIVE
Comment: NORMAL
Neisseria Gonorrhea: NEGATIVE
Trichomonas: NEGATIVE

## 2019-12-07 ENCOUNTER — Other Ambulatory Visit: Payer: Self-pay

## 2019-12-07 DIAGNOSIS — B9689 Other specified bacterial agents as the cause of diseases classified elsewhere: Secondary | ICD-10-CM

## 2019-12-07 DIAGNOSIS — N760 Acute vaginitis: Secondary | ICD-10-CM

## 2019-12-07 MED ORDER — METRONIDAZOLE 500 MG PO TABS: 500.0000 mg | ORAL_TABLET | Freq: Two times a day (BID) | ORAL | 0 refills | Status: AC

## 2019-12-07 NOTE — Progress Notes (Signed)
Rx sent as advised for BV.

## 2019-12-27 ENCOUNTER — Ambulatory Visit (HOSPITAL_COMMUNITY): Admission: RE | Admit: 2019-12-27 | Payer: PRIVATE HEALTH INSURANCE | Source: Ambulatory Visit

## 2020-01-01 ENCOUNTER — Telehealth: Payer: Self-pay | Admitting: Obstetrics and Gynecology

## 2020-01-01 NOTE — Telephone Encounter (Signed)
I spoke with Ms. Sprunger about receiving a financial application, and if she can bring it to the office as soon as she receives and fills it out.

## 2020-01-03 ENCOUNTER — Ambulatory Visit (HOSPITAL_COMMUNITY)
Admission: RE | Admit: 2020-01-03 | Discharge: 2020-01-03 | Disposition: A | Discharge status: Home / Self Care | Payer: Self-pay | Source: Ambulatory Visit | Attending: Obstetrics and Gynecology | Admitting: Obstetrics and Gynecology

## 2020-01-03 ENCOUNTER — Other Ambulatory Visit: Payer: Self-pay

## 2020-01-03 DIAGNOSIS — N83202 Unspecified ovarian cyst, left side: Secondary | ICD-10-CM | POA: Insufficient documentation

## 2020-05-31 IMAGING — US US PELVIS COMPLETE TRANSABD/TRANSVAG W DUPLEX
1 series · 13 of 25 positions shown · non-contrast
Comparison: None.

CLINICAL DATA: Pelvic pain

EXAM:
TRANSABDOMINAL AND TRANSVAGINAL ULTRASOUND OF PELVIS
DOPPLER ULTRASOUND OF OVARIES
TECHNIQUE: Study was performed transabdominally to optimize pelvic field of
view evaluation and transvaginally to optimize internal visceral
architecture evaluation.
Color and duplex Doppler ultrasound was utilized to evaluate blood
flow to the ovaries.

[Series 1: us pelvis complete transabd/transvag w duplex · 110 acquisitions, 13 frames shown]
[im 1/110]
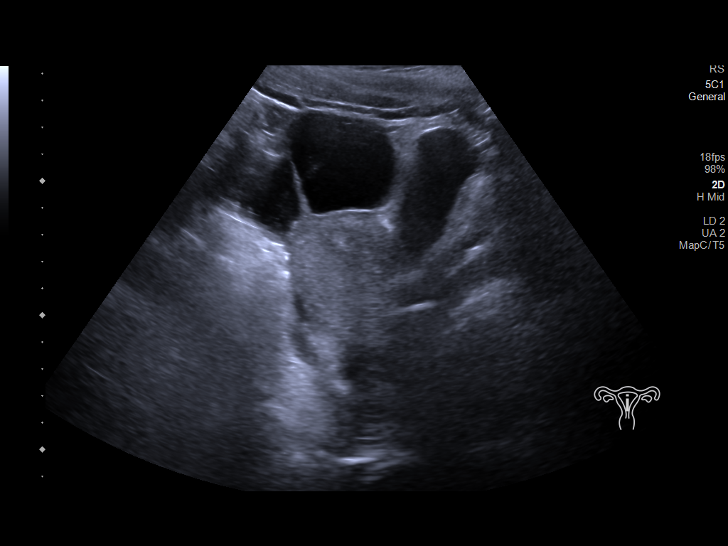
[im 10/110]
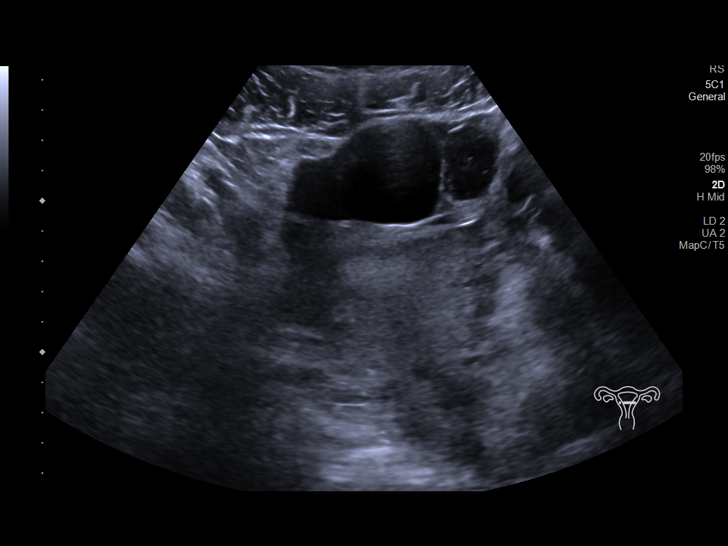
[im 19/110]
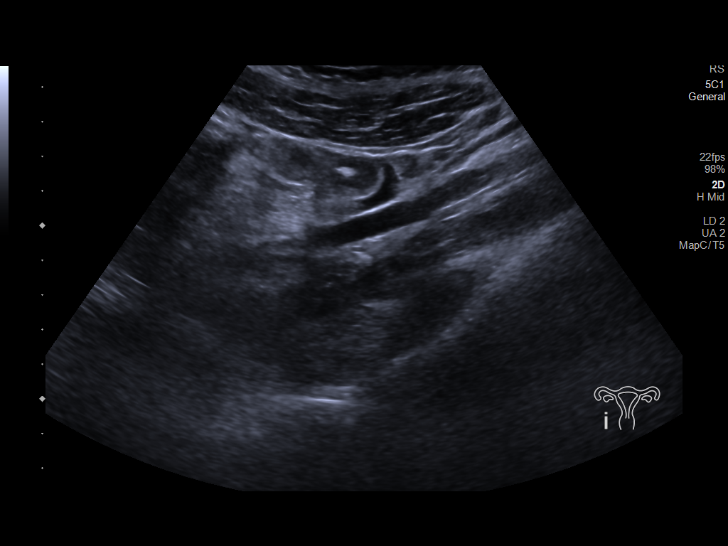
[im 28/110]
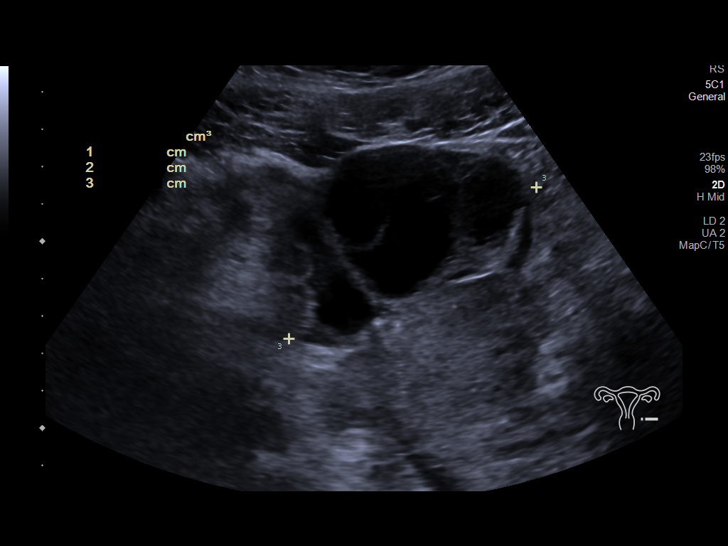
[im 37/110]
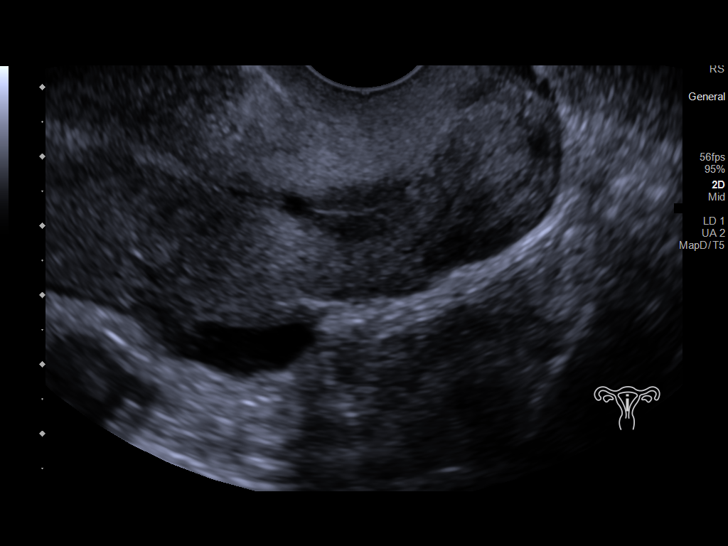
[im 46/110]
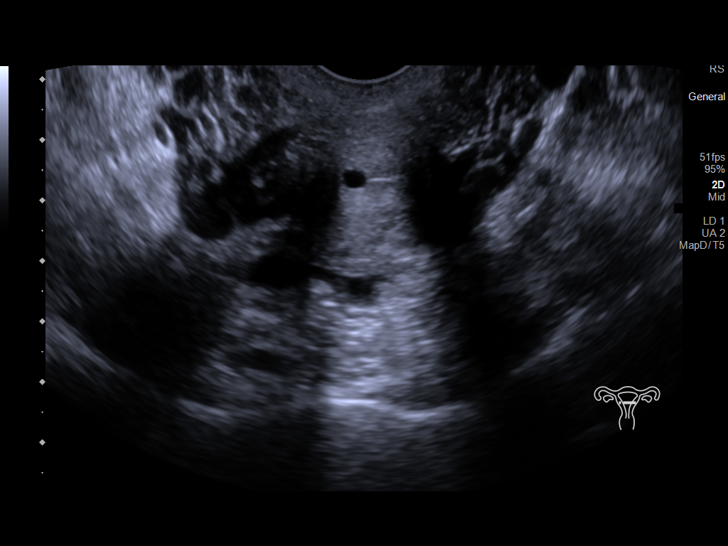
[im 55/110]
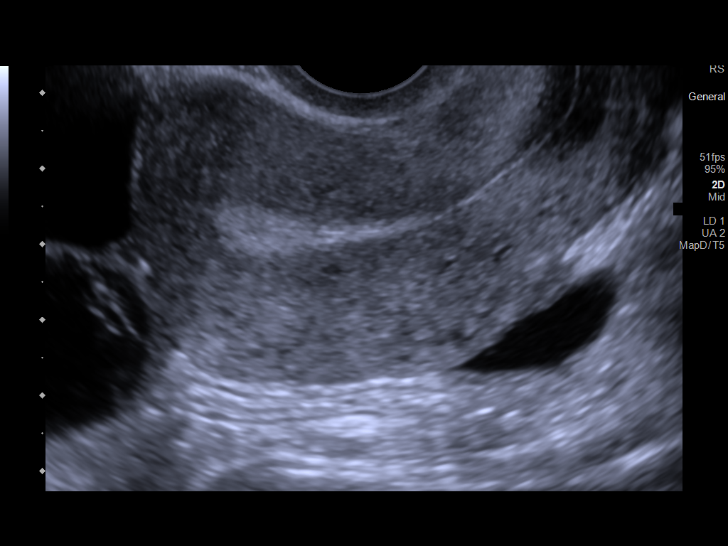
[im 64/110]
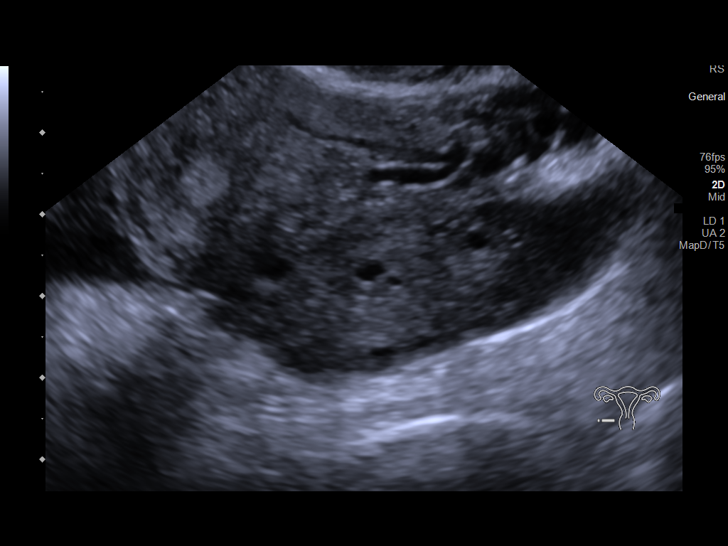
[im 73/110]
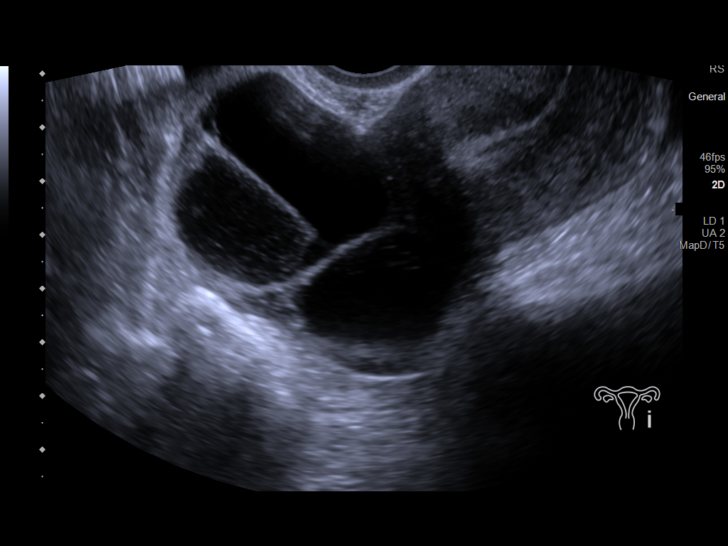
[im 82/110]
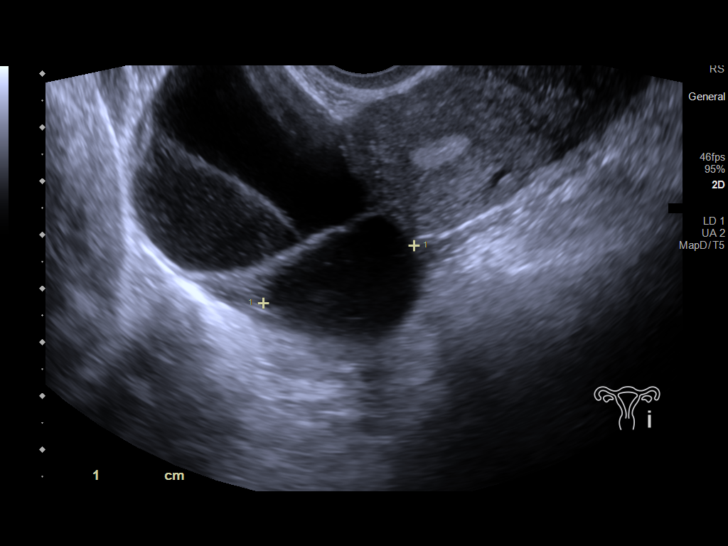
[im 91/110]
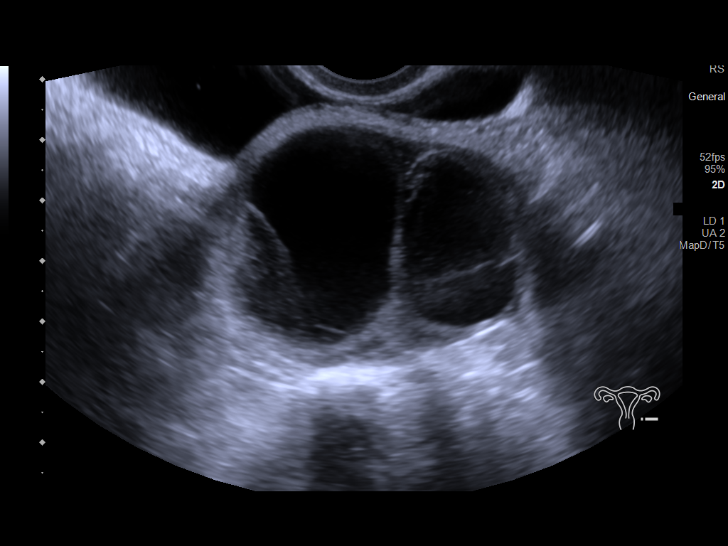
[im 100/110]
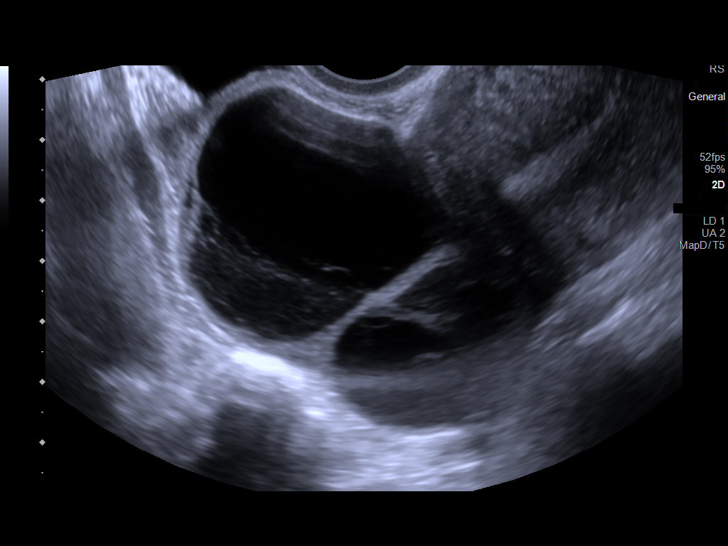
[im 110/110]
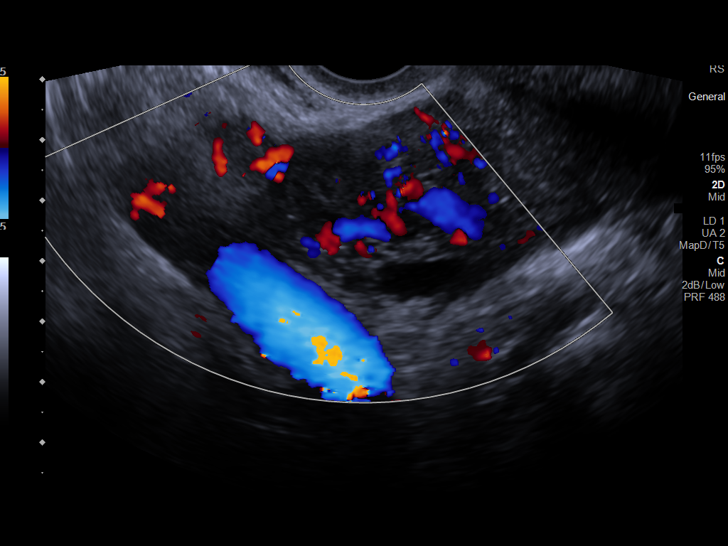

[13 of 25 positions shown; findings below may reference images not displayed]

FINDINGS: Uterus

Measurements: 8.3 x 3.4 x 5.8 cm = volume: 84.5 mL. No fibroids or
other mass visualized.

Endometrium

Thickness: 8 mm.  No focal abnormality visualized.

Right ovary

Measurements: 3.3 x 2.0 x 2.3 cm = volume: 8.0 mL. Normal
appearance/no adnexal mass.

Left ovary

Measurements: 3 7.0 x 4.5 x 7.2 cm = volume: 119.1 mL. There is a
complex multiseptated cystic mass arising from the left ovary which
contains debris. This complex mass measures 5.8 x 5.7 x 4.4 cm.

Pulsed Doppler evaluation of both ovaries demonstrates normal
low-resistance arterial and venous waveforms.

Other findings: There is a small amount of free pelvic fluid.
IMPRESSION: 1. There is a complex multi septated cystic left adnexal mass which
contains debris. Differential considerations for this mass include
hemorrhagic cyst, endometrioma, tubo-ovarian abscess, or atypical
appearing ovarian neoplasm. Gynecologic assessment advised.

2. There is low resistance Doppler flow in each ovary. No overt
ovarian torsion is seen currently. A left adnexal region mass of the
size noted on the left currently does place patient at increased
risk for intermittent ovarian torsion.

3. Small amount of fluid in the cul-de-sac region may be indicative
of recent ovarian cyst leakage or rupture.

4.  Uterus and endometrium appear unremarkable.

## 2020-08-01 IMAGING — US US TRANSVAGINAL NON-OB
1 series · 15 of 25 positions shown · non-contrast
Comparison: 11/02/2019

CLINICAL DATA: Follow-up ovarian cyst

EXAM:
ULTRASOUND PELVIS TRANSVAGINAL
TECHNIQUE: Transvaginal ultrasound examination of the pelvis was performed
including evaluation of the uterus, ovaries, adnexal regions, and
pelvic cul-de-sac.

[Series 1: us transvaginal non-ob · 15 of 37 slices shown]
[im 1/37]
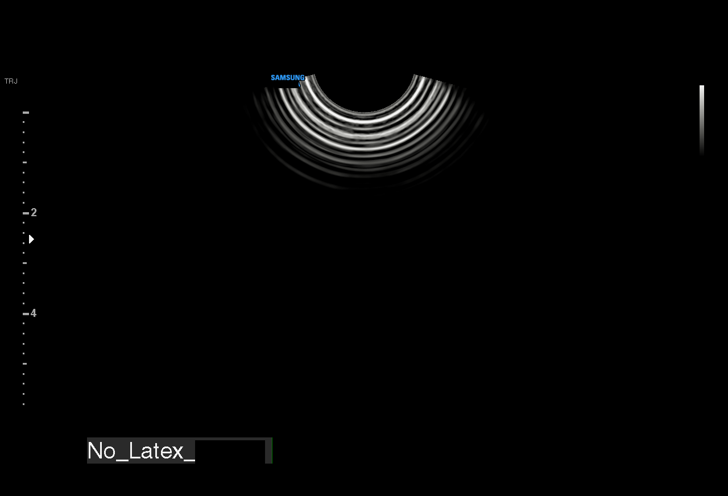
[im 4/37]
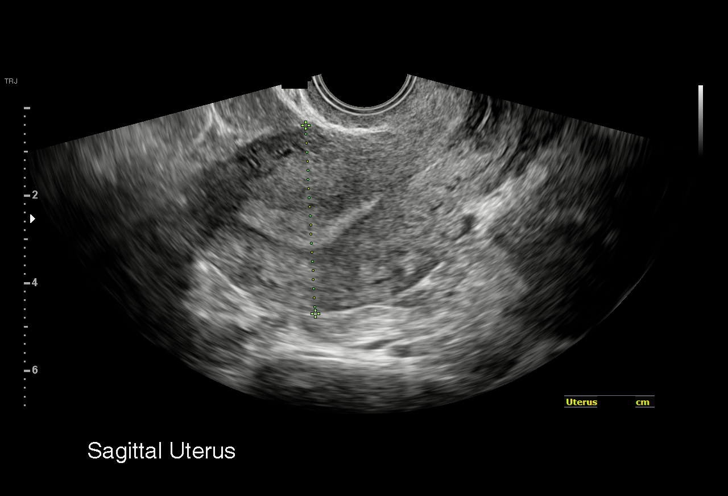
[im 7/37]
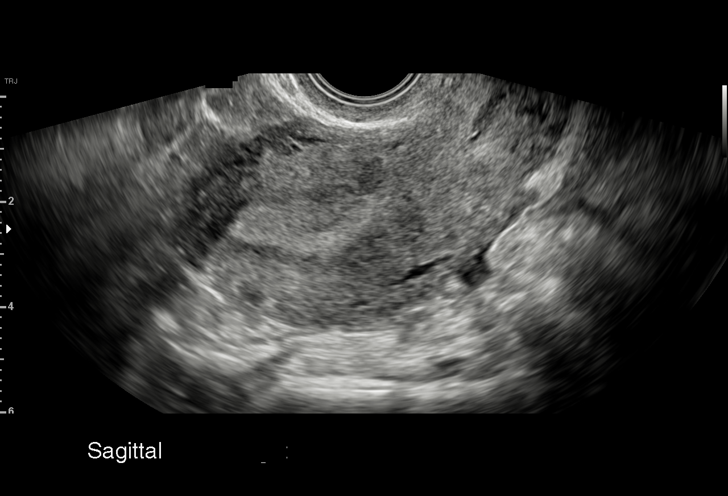
[im 8/37]
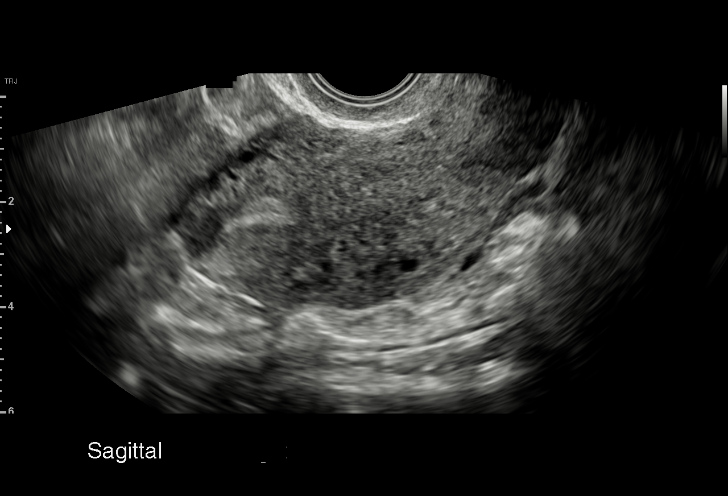
[im 11/37]
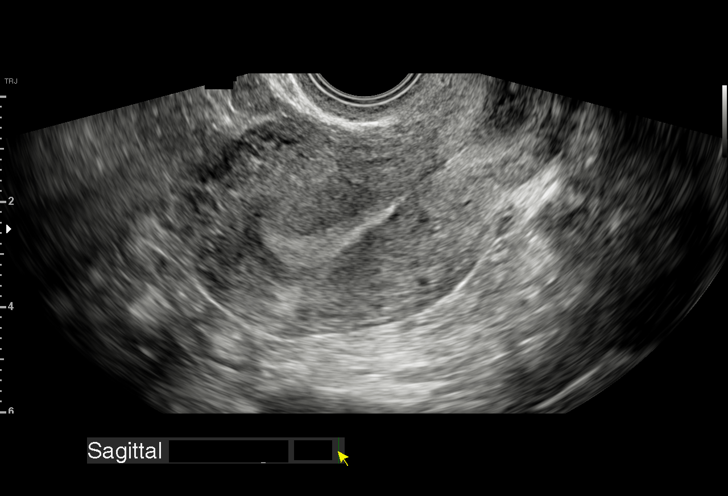
[im 14/37]
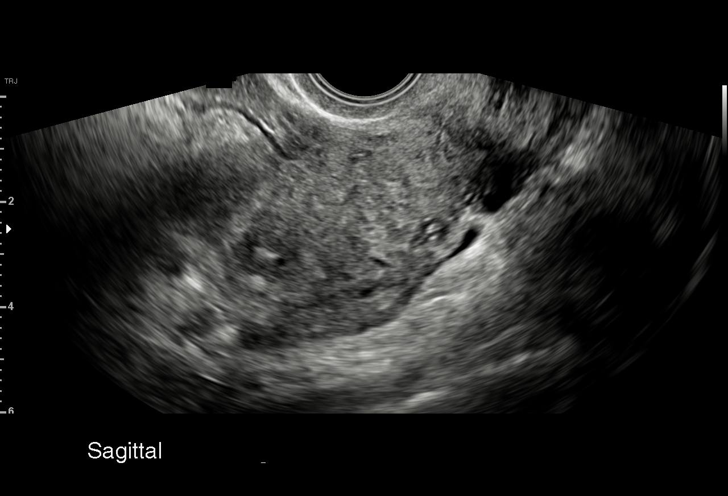
[im 16/37]
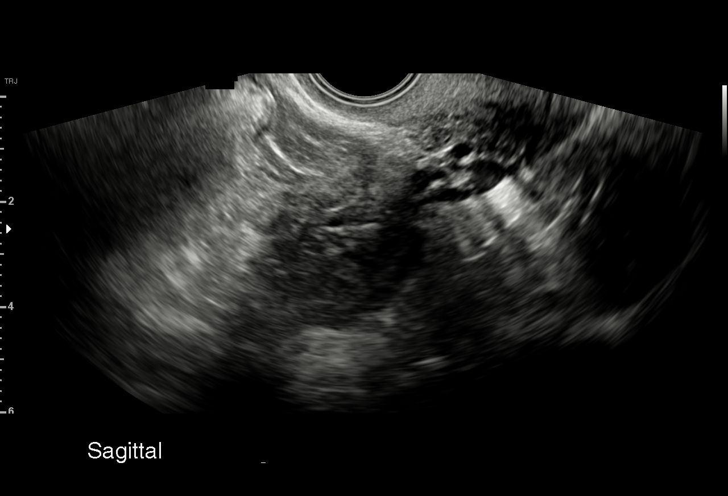
[im 19/37]
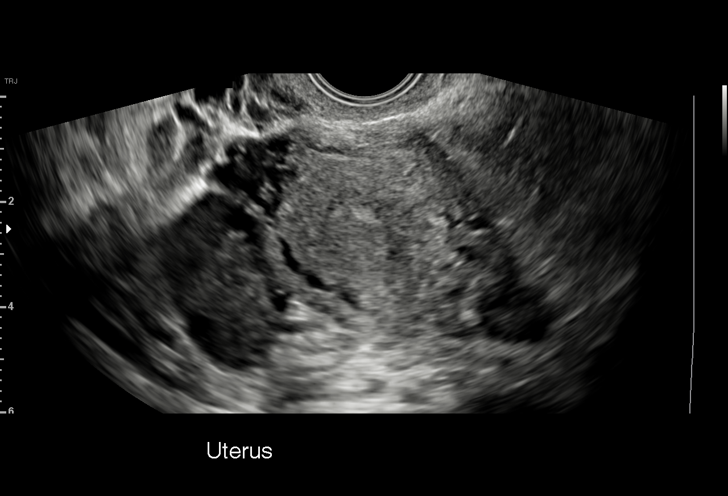
[im 22/37]
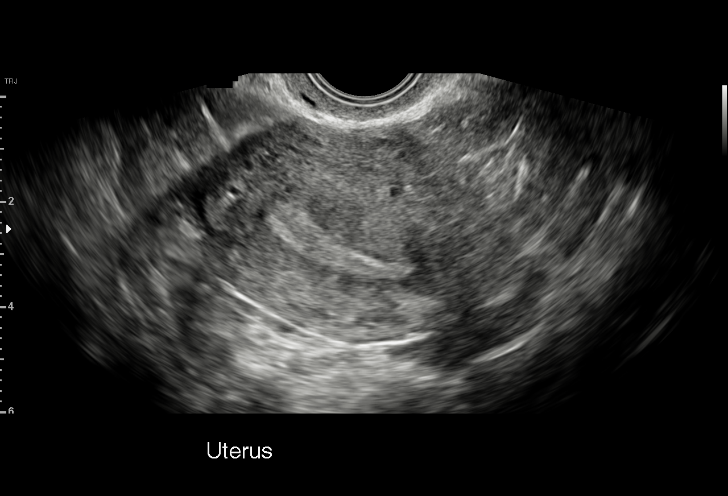
[im 23/37]
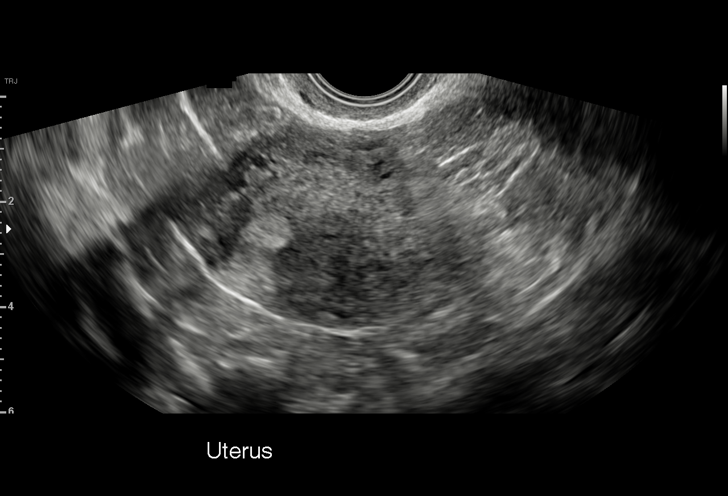
[im 26/37]
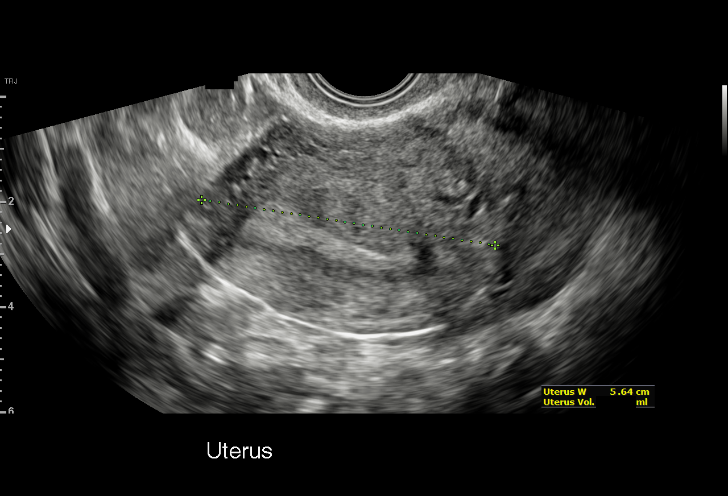
[im 29/37]
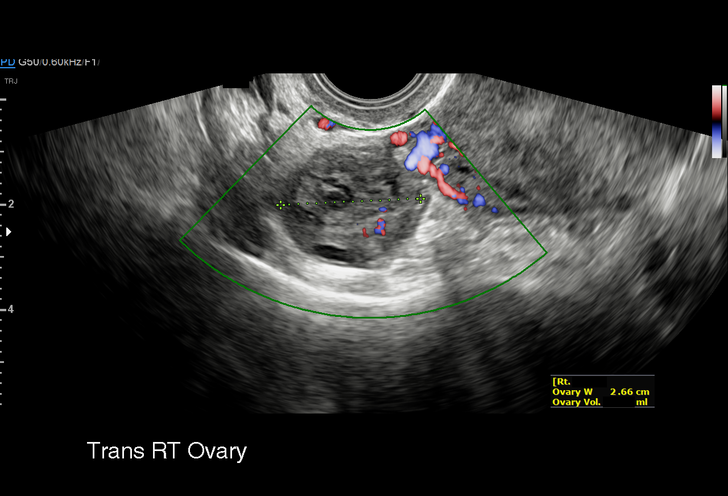
[im 31/37]
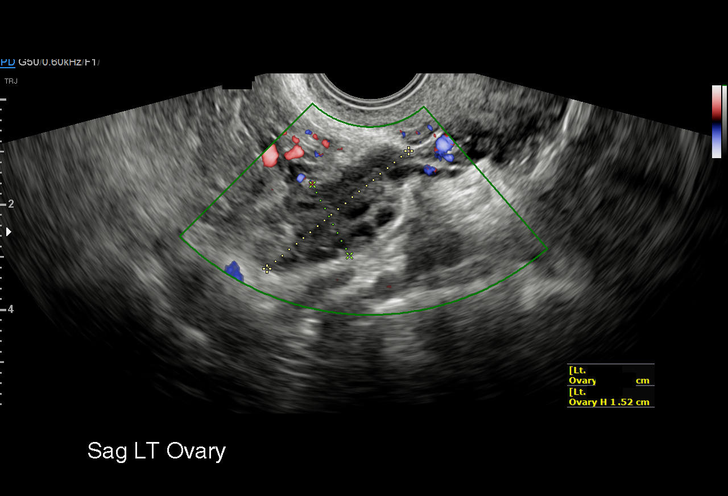
[im 34/37]
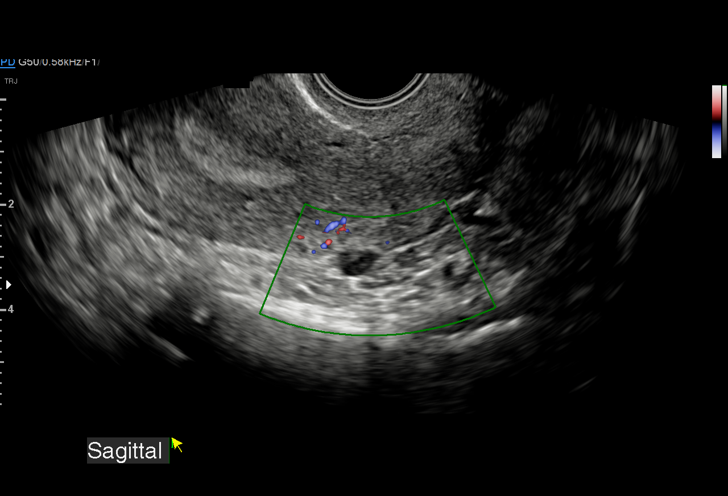
[im 37/37]
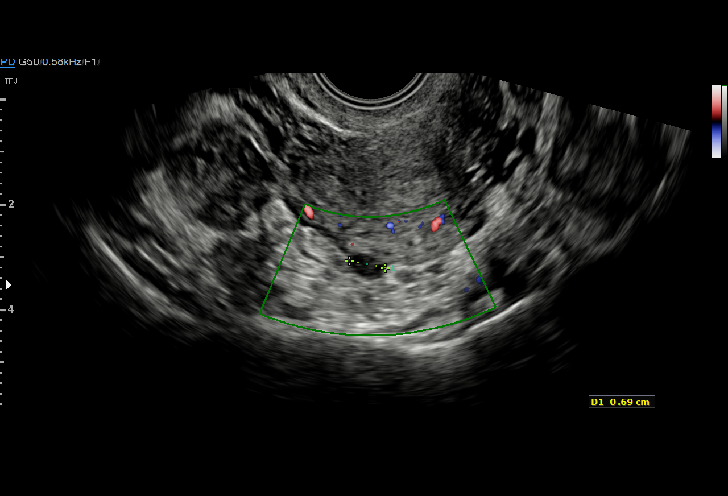

[15 of 25 positions shown; findings below may reference images not displayed]

FINDINGS: Uterus

Measurements: 8.7 x 4.3 x 5.6 cm = volume: 109.9 mL. Suspected small
exophytic fibroid off the posterior lower uterine segment measuring
8 mm.

Endometrium

Thickness: 7.4 mm.  No focal abnormality visualized.

Right ovary

Measurements: 3.8 x 2.4 x 2.7 cm = volume: 12.6 mL. Normal
appearance/no adnexal mass.

Left ovary

Measurements: 3.5 x 1.5 x 1.9 cm = volume: 5.3 mL. Normal
appearance/no adnexal mass.

Other findings:  No abnormal free fluid
IMPRESSION: 1. Resolution of previously noted complex left ovarian cyst. The
left ovary appears normal on today's study.
2. Probable small exophytic uterine fibroid

## 2022-12-23 DIAGNOSIS — Z3201 Encounter for pregnancy test, result positive: Secondary | ICD-10-CM | POA: Diagnosis not present

## 2022-12-23 DIAGNOSIS — Z3687 Encounter for antenatal screening for uncertain dates: Secondary | ICD-10-CM | POA: Diagnosis not present

## 2023-01-14 DIAGNOSIS — Z3481 Encounter for supervision of other normal pregnancy, first trimester: Secondary | ICD-10-CM | POA: Diagnosis not present

## 2023-01-14 DIAGNOSIS — Z3A15 15 weeks gestation of pregnancy: Secondary | ICD-10-CM | POA: Diagnosis not present

## 2023-02-01 DIAGNOSIS — Z3480 Encounter for supervision of other normal pregnancy, unspecified trimester: Secondary | ICD-10-CM | POA: Diagnosis not present

## 2023-02-01 DIAGNOSIS — N898 Other specified noninflammatory disorders of vagina: Secondary | ICD-10-CM | POA: Diagnosis not present

## 2023-02-01 DIAGNOSIS — Z113 Encounter for screening for infections with a predominantly sexual mode of transmission: Secondary | ICD-10-CM | POA: Diagnosis not present

## 2023-02-03 DIAGNOSIS — O30042 Twin pregnancy, dichorionic/diamniotic, second trimester: Secondary | ICD-10-CM | POA: Diagnosis not present

## 2023-02-03 DIAGNOSIS — Z3A18 18 weeks gestation of pregnancy: Secondary | ICD-10-CM | POA: Diagnosis not present

## 2023-03-01 DIAGNOSIS — Z8759 Personal history of other complications of pregnancy, childbirth and the puerperium: Secondary | ICD-10-CM | POA: Diagnosis not present

## 2023-03-01 DIAGNOSIS — O43192 Other malformation of placenta, second trimester: Secondary | ICD-10-CM | POA: Diagnosis not present

## 2023-03-01 DIAGNOSIS — O283 Abnormal ultrasonic finding on antenatal screening of mother: Secondary | ICD-10-CM | POA: Diagnosis not present

## 2023-03-01 DIAGNOSIS — Z3A21 21 weeks gestation of pregnancy: Secondary | ICD-10-CM | POA: Diagnosis not present

## 2023-03-01 DIAGNOSIS — Z3689 Encounter for other specified antenatal screening: Secondary | ICD-10-CM | POA: Diagnosis not present

## 2023-03-01 DIAGNOSIS — O30042 Twin pregnancy, dichorionic/diamniotic, second trimester: Secondary | ICD-10-CM | POA: Diagnosis not present

## 2023-04-02 DIAGNOSIS — O283 Abnormal ultrasonic finding on antenatal screening of mother: Secondary | ICD-10-CM | POA: Diagnosis not present

## 2023-04-02 DIAGNOSIS — Z8759 Personal history of other complications of pregnancy, childbirth and the puerperium: Secondary | ICD-10-CM | POA: Diagnosis not present

## 2023-04-02 DIAGNOSIS — O43122 Velamentous insertion of umbilical cord, second trimester: Secondary | ICD-10-CM | POA: Diagnosis not present

## 2023-04-02 DIAGNOSIS — O402XX1 Polyhydramnios, second trimester, fetus 1: Secondary | ICD-10-CM | POA: Diagnosis not present

## 2023-04-02 DIAGNOSIS — O365921 Maternal care for other known or suspected poor fetal growth, second trimester, fetus 1: Secondary | ICD-10-CM | POA: Diagnosis not present

## 2023-04-02 DIAGNOSIS — O43129 Velamentous insertion of umbilical cord, unspecified trimester: Secondary | ICD-10-CM | POA: Diagnosis not present

## 2023-04-02 DIAGNOSIS — O30049 Twin pregnancy, dichorionic/diamniotic, unspecified trimester: Secondary | ICD-10-CM | POA: Diagnosis not present

## 2023-04-20 DIAGNOSIS — Z3482 Encounter for supervision of other normal pregnancy, second trimester: Secondary | ICD-10-CM | POA: Diagnosis not present

## 2023-04-21 DIAGNOSIS — O99210 Obesity complicating pregnancy, unspecified trimester: Secondary | ICD-10-CM | POA: Diagnosis not present

## 2023-04-21 DIAGNOSIS — O402XX1 Polyhydramnios, second trimester, fetus 1: Secondary | ICD-10-CM | POA: Diagnosis not present

## 2023-04-21 DIAGNOSIS — O30043 Twin pregnancy, dichorionic/diamniotic, third trimester: Secondary | ICD-10-CM | POA: Diagnosis not present

## 2023-04-21 DIAGNOSIS — Z3A29 29 weeks gestation of pregnancy: Secondary | ICD-10-CM | POA: Diagnosis not present

## 2023-04-21 DIAGNOSIS — Z8759 Personal history of other complications of pregnancy, childbirth and the puerperium: Secondary | ICD-10-CM | POA: Diagnosis not present

## 2023-04-21 DIAGNOSIS — O365921 Maternal care for other known or suspected poor fetal growth, second trimester, fetus 1: Secondary | ICD-10-CM | POA: Diagnosis not present

## 2023-04-26 DIAGNOSIS — O99810 Abnormal glucose complicating pregnancy: Secondary | ICD-10-CM | POA: Diagnosis not present

## 2023-05-13 DIAGNOSIS — O403XX1 Polyhydramnios, third trimester, fetus 1: Secondary | ICD-10-CM | POA: Diagnosis not present

## 2023-05-13 DIAGNOSIS — Z369 Encounter for antenatal screening, unspecified: Secondary | ICD-10-CM | POA: Diagnosis not present

## 2023-05-13 DIAGNOSIS — O365990 Maternal care for other known or suspected poor fetal growth, unspecified trimester, not applicable or unspecified: Secondary | ICD-10-CM | POA: Diagnosis not present

## 2023-05-13 DIAGNOSIS — O30049 Twin pregnancy, dichorionic/diamniotic, unspecified trimester: Secondary | ICD-10-CM | POA: Diagnosis not present

## 2023-05-13 DIAGNOSIS — O43129 Velamentous insertion of umbilical cord, unspecified trimester: Secondary | ICD-10-CM | POA: Diagnosis not present

## 2023-05-13 DIAGNOSIS — Z8759 Personal history of other complications of pregnancy, childbirth and the puerperium: Secondary | ICD-10-CM | POA: Diagnosis not present

## 2023-05-20 DIAGNOSIS — O365921 Maternal care for other known or suspected poor fetal growth, second trimester, fetus 1: Secondary | ICD-10-CM | POA: Diagnosis not present

## 2023-05-20 DIAGNOSIS — Z369 Encounter for antenatal screening, unspecified: Secondary | ICD-10-CM | POA: Diagnosis not present

## 2023-05-20 DIAGNOSIS — O403XX1 Polyhydramnios, third trimester, fetus 1: Secondary | ICD-10-CM | POA: Diagnosis not present

## 2023-05-20 DIAGNOSIS — O43129 Velamentous insertion of umbilical cord, unspecified trimester: Secondary | ICD-10-CM | POA: Diagnosis not present

## 2023-05-20 DIAGNOSIS — O30043 Twin pregnancy, dichorionic/diamniotic, third trimester: Secondary | ICD-10-CM | POA: Diagnosis not present

## 2023-05-25 DIAGNOSIS — O1213 Gestational proteinuria, third trimester: Secondary | ICD-10-CM | POA: Diagnosis not present

## 2023-05-27 DIAGNOSIS — O365931 Maternal care for other known or suspected poor fetal growth, third trimester, fetus 1: Secondary | ICD-10-CM | POA: Diagnosis not present

## 2023-05-27 DIAGNOSIS — Z369 Encounter for antenatal screening, unspecified: Secondary | ICD-10-CM | POA: Diagnosis not present

## 2023-05-27 DIAGNOSIS — O403XX1 Polyhydramnios, third trimester, fetus 1: Secondary | ICD-10-CM | POA: Diagnosis not present

## 2023-05-27 DIAGNOSIS — O43129 Velamentous insertion of umbilical cord, unspecified trimester: Secondary | ICD-10-CM | POA: Diagnosis not present

## 2023-05-27 DIAGNOSIS — O30043 Twin pregnancy, dichorionic/diamniotic, third trimester: Secondary | ICD-10-CM | POA: Diagnosis not present

## 2023-06-03 DIAGNOSIS — O43199 Other malformation of placenta, unspecified trimester: Secondary | ICD-10-CM | POA: Diagnosis not present

## 2023-06-03 DIAGNOSIS — O30049 Twin pregnancy, dichorionic/diamniotic, unspecified trimester: Secondary | ICD-10-CM | POA: Diagnosis not present

## 2023-06-03 DIAGNOSIS — Z3A35 35 weeks gestation of pregnancy: Secondary | ICD-10-CM | POA: Diagnosis not present

## 2023-06-03 DIAGNOSIS — O365911 Maternal care for other known or suspected poor fetal growth, first trimester, fetus 1: Secondary | ICD-10-CM | POA: Diagnosis not present

## 2023-06-03 DIAGNOSIS — O43129 Velamentous insertion of umbilical cord, unspecified trimester: Secondary | ICD-10-CM | POA: Diagnosis not present

## 2023-06-03 DIAGNOSIS — O30043 Twin pregnancy, dichorionic/diamniotic, third trimester: Secondary | ICD-10-CM | POA: Diagnosis not present

## 2023-06-03 DIAGNOSIS — Z8759 Personal history of other complications of pregnancy, childbirth and the puerperium: Secondary | ICD-10-CM | POA: Diagnosis not present

## 2023-06-03 DIAGNOSIS — O365921 Maternal care for other known or suspected poor fetal growth, second trimester, fetus 1: Secondary | ICD-10-CM | POA: Diagnosis not present

## 2023-06-03 DIAGNOSIS — O402XX1 Polyhydramnios, second trimester, fetus 1: Secondary | ICD-10-CM | POA: Diagnosis not present

## 2023-06-04 DIAGNOSIS — Z3A35 35 weeks gestation of pregnancy: Secondary | ICD-10-CM | POA: Diagnosis not present

## 2023-06-04 DIAGNOSIS — Z87891 Personal history of nicotine dependence: Secondary | ICD-10-CM | POA: Diagnosis not present

## 2023-06-04 DIAGNOSIS — O30043 Twin pregnancy, dichorionic/diamniotic, third trimester: Secondary | ICD-10-CM | POA: Diagnosis not present

## 2023-06-08 DIAGNOSIS — Z3480 Encounter for supervision of other normal pregnancy, unspecified trimester: Secondary | ICD-10-CM | POA: Diagnosis not present

## 2023-06-09 DIAGNOSIS — O42913 Preterm premature rupture of membranes, unspecified as to length of time between rupture and onset of labor, third trimester: Secondary | ICD-10-CM | POA: Diagnosis not present

## 2023-06-09 DIAGNOSIS — Z3A35 35 weeks gestation of pregnancy: Secondary | ICD-10-CM | POA: Diagnosis not present

## 2023-06-09 DIAGNOSIS — O402XX1 Polyhydramnios, second trimester, fetus 1: Secondary | ICD-10-CM | POA: Diagnosis not present

## 2023-06-09 DIAGNOSIS — Z87891 Personal history of nicotine dependence: Secondary | ICD-10-CM | POA: Diagnosis not present

## 2023-06-09 DIAGNOSIS — O43193 Other malformation of placenta, third trimester: Secondary | ICD-10-CM | POA: Diagnosis not present

## 2023-06-09 DIAGNOSIS — Z3483 Encounter for supervision of other normal pregnancy, third trimester: Secondary | ICD-10-CM | POA: Diagnosis not present

## 2023-06-09 DIAGNOSIS — O365931 Maternal care for other known or suspected poor fetal growth, third trimester, fetus 1: Secondary | ICD-10-CM | POA: Diagnosis not present

## 2023-06-09 DIAGNOSIS — O9081 Anemia of the puerperium: Secondary | ICD-10-CM | POA: Diagnosis not present

## 2023-06-09 DIAGNOSIS — O321XX2 Maternal care for breech presentation, fetus 2: Secondary | ICD-10-CM | POA: Diagnosis not present

## 2023-06-09 DIAGNOSIS — O43123 Velamentous insertion of umbilical cord, third trimester: Secondary | ICD-10-CM | POA: Diagnosis not present

## 2023-06-09 DIAGNOSIS — O403XX1 Polyhydramnios, third trimester, fetus 1: Secondary | ICD-10-CM | POA: Diagnosis not present

## 2023-06-09 DIAGNOSIS — Z3A36 36 weeks gestation of pregnancy: Secondary | ICD-10-CM | POA: Diagnosis not present

## 2023-06-09 DIAGNOSIS — O164 Unspecified maternal hypertension, complicating childbirth: Secondary | ICD-10-CM | POA: Diagnosis not present

## 2023-06-09 DIAGNOSIS — O365131 Maternal care for known or suspected placental insufficiency, third trimester, fetus 1: Secondary | ICD-10-CM | POA: Diagnosis not present

## 2023-06-09 DIAGNOSIS — O30043 Twin pregnancy, dichorionic/diamniotic, third trimester: Secondary | ICD-10-CM | POA: Diagnosis not present

## 2023-07-23 DIAGNOSIS — N898 Other specified noninflammatory disorders of vagina: Secondary | ICD-10-CM | POA: Diagnosis not present

## 2023-08-12 DIAGNOSIS — Z30430 Encounter for insertion of intrauterine contraceptive device: Secondary | ICD-10-CM | POA: Diagnosis not present

## 2023-08-18 DIAGNOSIS — N920 Excessive and frequent menstruation with regular cycle: Secondary | ICD-10-CM | POA: Diagnosis not present

## 2023-08-18 DIAGNOSIS — Z30431 Encounter for routine checking of intrauterine contraceptive device: Secondary | ICD-10-CM | POA: Diagnosis not present

## 2023-08-18 DIAGNOSIS — N939 Abnormal uterine and vaginal bleeding, unspecified: Secondary | ICD-10-CM | POA: Diagnosis not present

## 2023-08-18 DIAGNOSIS — Z975 Presence of (intrauterine) contraceptive device: Secondary | ICD-10-CM | POA: Diagnosis not present
